# Patient Record
Sex: Male | Born: 1965 | Race: White | Hispanic: No | Marital: Married | Smoking: Never smoker
Health system: Southern US, Community
[De-identification: ages and names within clinical notes are randomized; demographics above are authoritative.]

## PROBLEM LIST (undated history)

## (undated) DIAGNOSIS — K5090 Crohn's disease, unspecified, without complications: Secondary | ICD-10-CM

## (undated) DIAGNOSIS — G4730 Sleep apnea, unspecified: Secondary | ICD-10-CM

## (undated) HISTORY — PX: ABDOMINAL SURGERY: SHX537

---

## 2017-04-06 ENCOUNTER — Inpatient Hospital Stay (HOSPITAL_COMMUNITY)
Admission: EM | Admit: 2017-04-06 | Discharge: 2017-04-07 | DRG: 101 | Disposition: A | Discharge status: Home / Self Care | Payer: Non-veteran care | Attending: Internal Medicine | Admitting: Internal Medicine

## 2017-04-06 ENCOUNTER — Emergency Department (HOSPITAL_COMMUNITY): Payer: Non-veteran care

## 2017-04-06 ENCOUNTER — Encounter (HOSPITAL_COMMUNITY): Payer: Self-pay | Admitting: *Deleted

## 2017-04-06 DIAGNOSIS — D72829 Elevated white blood cell count, unspecified: Secondary | ICD-10-CM | POA: Diagnosis present

## 2017-04-06 DIAGNOSIS — E7220 Disorder of urea cycle metabolism, unspecified: Secondary | ICD-10-CM | POA: Diagnosis present

## 2017-04-06 DIAGNOSIS — G4733 Obstructive sleep apnea (adult) (pediatric): Secondary | ICD-10-CM | POA: Diagnosis present

## 2017-04-06 DIAGNOSIS — F329 Major depressive disorder, single episode, unspecified: Secondary | ICD-10-CM | POA: Diagnosis present

## 2017-04-06 DIAGNOSIS — Z888 Allergy status to other drugs, medicaments and biological substances status: Secondary | ICD-10-CM

## 2017-04-06 DIAGNOSIS — N179 Acute kidney failure, unspecified: Secondary | ICD-10-CM | POA: Diagnosis present

## 2017-04-06 DIAGNOSIS — E871 Hypo-osmolality and hyponatremia: Secondary | ICD-10-CM | POA: Diagnosis present

## 2017-04-06 DIAGNOSIS — M6282 Rhabdomyolysis: Secondary | ICD-10-CM | POA: Diagnosis present

## 2017-04-06 DIAGNOSIS — F32A Depression, unspecified: Secondary | ICD-10-CM | POA: Diagnosis present

## 2017-04-06 DIAGNOSIS — G40409 Other generalized epilepsy and epileptic syndromes, not intractable, without status epilepticus: Secondary | ICD-10-CM | POA: Diagnosis not present

## 2017-04-06 DIAGNOSIS — E872 Acidosis, unspecified: Secondary | ICD-10-CM

## 2017-04-06 DIAGNOSIS — N289 Disorder of kidney and ureter, unspecified: Secondary | ICD-10-CM

## 2017-04-06 DIAGNOSIS — Z885 Allergy status to narcotic agent status: Secondary | ICD-10-CM

## 2017-04-06 DIAGNOSIS — R569 Unspecified convulsions: Secondary | ICD-10-CM

## 2017-04-06 DIAGNOSIS — K5090 Crohn's disease, unspecified, without complications: Secondary | ICD-10-CM | POA: Diagnosis present

## 2017-04-06 DIAGNOSIS — G4730 Sleep apnea, unspecified: Secondary | ICD-10-CM | POA: Diagnosis present

## 2017-04-06 HISTORY — DX: Sleep apnea, unspecified: G47.30

## 2017-04-06 HISTORY — DX: Crohn's disease, unspecified, without complications: K50.90

## 2017-04-06 LAB — URINALYSIS, COMPLETE (UACMP) WITH MICROSCOPIC
BACTERIA UA: NONE SEEN
BILIRUBIN URINE: NEGATIVE
Glucose, UA: NEGATIVE mg/dL
HGB URINE DIPSTICK: NEGATIVE
Ketones, ur: NEGATIVE mg/dL
LEUKOCYTES UA: NEGATIVE
NITRITE: NEGATIVE
PROTEIN: NEGATIVE mg/dL
SPECIFIC GRAVITY, URINE: 1.013 (ref 1.005–1.030)
SQUAMOUS EPITHELIAL / LPF: NONE SEEN
pH: 5 (ref 5.0–8.0)

## 2017-04-06 LAB — ETHANOL

## 2017-04-06 LAB — BASIC METABOLIC PANEL
Anion gap: 17 — ABNORMAL HIGH (ref 5–15)
BUN: 15 mg/dL (ref 6–20)
CHLORIDE: 98 mmol/L — AB (ref 101–111)
CO2: 14 mmol/L — ABNORMAL LOW (ref 22–32)
CREATININE: 1.74 mg/dL — AB (ref 0.61–1.24)
Calcium: 8.8 mg/dL — ABNORMAL LOW (ref 8.9–10.3)
GFR calc non Af Amer: 44 mL/min — ABNORMAL LOW (ref >60)
GFR, EST AFRICAN AMERICAN: 51 mL/min — AB (ref >60)
GLUCOSE: 165 mg/dL — AB (ref 65–99)
Potassium: 4.8 mmol/L (ref 3.5–5.1)
SODIUM: 129 mmol/L — AB (ref 135–145)

## 2017-04-06 LAB — HEPATIC FUNCTION PANEL
ALT: 45 U/L (ref 17–63)
AST: 42 U/L — ABNORMAL HIGH (ref 15–41)
Albumin: 4.3 g/dL (ref 3.5–5.0)
Alkaline Phosphatase: 91 U/L (ref 38–126)
Bilirubin, Direct: <0.1 mg/dL — ABNORMAL LOW (ref 0.1–0.5)
TOTAL PROTEIN: 7.6 g/dL (ref 6.5–8.1)
Total Bilirubin: 0.6 mg/dL (ref 0.3–1.2)

## 2017-04-06 LAB — MAGNESIUM: Magnesium: 2.7 mg/dL — ABNORMAL HIGH (ref 1.7–2.4)

## 2017-04-06 LAB — AMMONIA
AMMONIA: 48 umol/L — AB (ref 9–35)
Ammonia: 156 umol/L — ABNORMAL HIGH (ref 9–35)

## 2017-04-06 LAB — RAPID URINE DRUG SCREEN, HOSP PERFORMED
AMPHETAMINES: NOT DETECTED
Barbiturates: NOT DETECTED
Benzodiazepines: POSITIVE — AB
Cocaine: NOT DETECTED
OPIATES: NOT DETECTED
TETRAHYDROCANNABINOL: NOT DETECTED

## 2017-04-06 LAB — PHOSPHORUS: PHOSPHORUS: 3 mg/dL (ref 2.5–4.6)

## 2017-04-06 LAB — I-STAT ARTERIAL BLOOD GAS, ED
ACID-BASE DEFICIT: 7 mmol/L — AB (ref 0.0–2.0)
Bicarbonate: 20.3 mmol/L (ref 20.0–28.0)
O2 SAT: 100 %
PH ART: 7.257 — AB (ref 7.350–7.450)
TCO2: 22 mmol/L (ref 22–32)
pCO2 arterial: 45.3 mmHg (ref 32.0–48.0)
pO2, Arterial: 501 mmHg — ABNORMAL HIGH (ref 83.0–108.0)

## 2017-04-06 LAB — CBG MONITORING, ED: GLUCOSE-CAPILLARY: 164 mg/dL — AB (ref 65–99)

## 2017-04-06 LAB — CBC
HCT: 43.2 % (ref 39.0–52.0)
Hemoglobin: 14.6 g/dL (ref 13.0–17.0)
MCH: 30.9 pg (ref 26.0–34.0)
MCHC: 33.8 g/dL (ref 30.0–36.0)
MCV: 91.5 fL (ref 78.0–100.0)
PLATELETS: 248 10*3/uL (ref 150–400)
RBC: 4.72 MIL/uL (ref 4.22–5.81)
RDW: 12.9 % (ref 11.5–15.5)
WBC: 14.7 10*3/uL — ABNORMAL HIGH (ref 4.0–10.5)

## 2017-04-06 LAB — SALICYLATE LEVEL: Salicylate Lvl: <7 mg/dL (ref 2.8–30.0)

## 2017-04-06 LAB — I-STAT TROPONIN, ED: TROPONIN I, POC: 0.02 ng/mL (ref 0.00–0.08)

## 2017-04-06 LAB — I-STAT CG4 LACTIC ACID, ED: LACTIC ACID, VENOUS: 9.13 mmol/L — AB (ref 0.5–1.9)

## 2017-04-06 LAB — ACETAMINOPHEN LEVEL: Acetaminophen (Tylenol), Serum: <10 ug/mL — ABNORMAL LOW (ref 10–30)

## 2017-04-06 MED ORDER — SODIUM CHLORIDE 0.9 % IV BOLUS (SEPSIS)
1000.0000 mL | Freq: Once | INTRAVENOUS | Status: AC
  Administered 2017-04-06: 1000 mL via INTRAVENOUS

## 2017-04-06 MED ORDER — SODIUM CHLORIDE 0.9 % IV SOLN
1500.0000 mg | Freq: Once | INTRAVENOUS | Status: AC
  Administered 2017-04-06: 1500 mg via INTRAVENOUS
  Filled 2017-04-06: qty 15

## 2017-04-06 MED ORDER — ONDANSETRON HCL 4 MG/2ML IJ SOLN
4.0000 mg | Freq: Once | INTRAMUSCULAR | Status: AC
Start: 2017-04-06 — End: 2017-04-06
  Administered 2017-04-06: 4 mg via INTRAVENOUS

## 2017-04-06 MED ORDER — ONDANSETRON HCL 4 MG/2ML IJ SOLN
INTRAMUSCULAR | Status: AC
  Filled 2017-04-06: qty 2

## 2017-04-06 MED ORDER — FENTANYL CITRATE (PF) 100 MCG/2ML IJ SOLN
50.0000 ug | Freq: Once | INTRAMUSCULAR | Status: AC
  Administered 2017-04-06: 50 ug via INTRAVENOUS

## 2017-04-06 MED ORDER — OXYCODONE-ACETAMINOPHEN 5-325 MG PO TABS
1.0000 | ORAL_TABLET | Freq: Once | ORAL | Status: AC
  Administered 2017-04-07: 1 via ORAL
  Filled 2017-04-06: qty 1

## 2017-04-06 MED ORDER — FENTANYL CITRATE (PF) 100 MCG/2ML IJ SOLN
INTRAMUSCULAR | Status: AC
  Filled 2017-04-06: qty 2

## 2017-04-06 NOTE — ED Notes (Signed)
Patient transported to CT 
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
## 2017-04-06 NOTE — ED Notes (Signed)
Neurologist at bedside. 

## 2017-04-06 NOTE — ED Notes (Signed)
C-collar and nasal trumpet removed per Dr.Harris

## 2017-04-07 ENCOUNTER — Encounter (HOSPITAL_COMMUNITY): Payer: Self-pay | Admitting: Family Medicine

## 2017-04-07 DIAGNOSIS — E872 Acidosis, unspecified: Secondary | ICD-10-CM | POA: Diagnosis present

## 2017-04-07 DIAGNOSIS — G4730 Sleep apnea, unspecified: Secondary | ICD-10-CM | POA: Diagnosis present

## 2017-04-07 DIAGNOSIS — K50919 Crohn's disease, unspecified, with unspecified complications: Secondary | ICD-10-CM

## 2017-04-07 DIAGNOSIS — G4733 Obstructive sleep apnea (adult) (pediatric): Secondary | ICD-10-CM | POA: Diagnosis present

## 2017-04-07 DIAGNOSIS — K5090 Crohn's disease, unspecified, without complications: Secondary | ICD-10-CM | POA: Diagnosis present

## 2017-04-07 DIAGNOSIS — R569 Unspecified convulsions: Secondary | ICD-10-CM | POA: Diagnosis not present

## 2017-04-07 DIAGNOSIS — F32A Depression, unspecified: Secondary | ICD-10-CM | POA: Diagnosis present

## 2017-04-07 DIAGNOSIS — E7220 Disorder of urea cycle metabolism, unspecified: Secondary | ICD-10-CM

## 2017-04-07 DIAGNOSIS — N179 Acute kidney failure, unspecified: Secondary | ICD-10-CM | POA: Diagnosis present

## 2017-04-07 DIAGNOSIS — F329 Major depressive disorder, single episode, unspecified: Secondary | ICD-10-CM | POA: Diagnosis present

## 2017-04-07 DIAGNOSIS — N289 Disorder of kidney and ureter, unspecified: Secondary | ICD-10-CM

## 2017-04-07 DIAGNOSIS — Z888 Allergy status to other drugs, medicaments and biological substances status: Secondary | ICD-10-CM | POA: Diagnosis not present

## 2017-04-07 DIAGNOSIS — Z885 Allergy status to narcotic agent status: Secondary | ICD-10-CM | POA: Diagnosis not present

## 2017-04-07 DIAGNOSIS — E871 Hypo-osmolality and hyponatremia: Secondary | ICD-10-CM | POA: Diagnosis present

## 2017-04-07 DIAGNOSIS — M6282 Rhabdomyolysis: Secondary | ICD-10-CM | POA: Diagnosis present

## 2017-04-07 DIAGNOSIS — D72829 Elevated white blood cell count, unspecified: Secondary | ICD-10-CM | POA: Diagnosis present

## 2017-04-07 DIAGNOSIS — G40409 Other generalized epilepsy and epileptic syndromes, not intractable, without status epilepticus: Secondary | ICD-10-CM | POA: Diagnosis present

## 2017-04-07 LAB — CBC WITH DIFFERENTIAL/PLATELET
BASOS ABS: 0 10*3/uL (ref 0.0–0.1)
Basophils Relative: 0 %
EOS PCT: 1 %
Eosinophils Absolute: 0.1 10*3/uL (ref 0.0–0.7)
HCT: 34.8 % — ABNORMAL LOW (ref 39.0–52.0)
Hemoglobin: 11.8 g/dL — ABNORMAL LOW (ref 13.0–17.0)
LYMPHS ABS: 1.5 10*3/uL (ref 0.7–4.0)
LYMPHS PCT: 12 %
MCH: 31 pg (ref 26.0–34.0)
MCHC: 33.9 g/dL (ref 30.0–36.0)
MCV: 91.3 fL (ref 78.0–100.0)
MONO ABS: 1.1 10*3/uL — AB (ref 0.1–1.0)
Monocytes Relative: 9 %
Neutro Abs: 9.3 10*3/uL — ABNORMAL HIGH (ref 1.7–7.7)
Neutrophils Relative %: 78 %
PLATELETS: 176 10*3/uL (ref 150–400)
RBC: 3.81 MIL/uL — ABNORMAL LOW (ref 4.22–5.81)
RDW: 13.4 % (ref 11.5–15.5)
WBC: 12 10*3/uL — ABNORMAL HIGH (ref 4.0–10.5)

## 2017-04-07 LAB — LACTIC ACID, PLASMA
Lactic Acid, Venous: 1.2 mmol/L (ref 0.5–1.9)
Lactic Acid, Venous: 1.2 mmol/L (ref 0.5–1.9)
Lactic Acid, Venous: 1.4 mmol/L (ref 0.5–1.9)
Lactic Acid, Venous: 1.3 mmol/L (ref 0.5–1.9)

## 2017-04-07 LAB — COMPREHENSIVE METABOLIC PANEL
ALBUMIN: 3.4 g/dL — AB (ref 3.5–5.0)
ALK PHOS: 71 U/L (ref 38–126)
ALT: 39 U/L (ref 17–63)
AST: 42 U/L — AB (ref 15–41)
Anion gap: 5 (ref 5–15)
BILIRUBIN TOTAL: 0.8 mg/dL (ref 0.3–1.2)
BUN: 12 mg/dL (ref 6–20)
CO2: 27 mmol/L (ref 22–32)
CREATININE: 1.64 mg/dL — AB (ref 0.61–1.24)
Calcium: 8 mg/dL — ABNORMAL LOW (ref 8.9–10.3)
Chloride: 107 mmol/L (ref 101–111)
GFR calc Af Amer: 55 mL/min — ABNORMAL LOW (ref >60)
GFR calc non Af Amer: 47 mL/min — ABNORMAL LOW (ref >60)
GLUCOSE: 113 mg/dL — AB (ref 65–99)
POTASSIUM: 4.2 mmol/L (ref 3.5–5.1)
Sodium: 139 mmol/L (ref 135–145)
TOTAL PROTEIN: 5.8 g/dL — AB (ref 6.5–8.1)

## 2017-04-07 LAB — BASIC METABOLIC PANEL
Anion gap: 6 (ref 5–15)
BUN: 11 mg/dL (ref 6–20)
CALCIUM: 8 mg/dL — AB (ref 8.9–10.3)
CO2: 23 mmol/L (ref 22–32)
Chloride: 109 mmol/L (ref 101–111)
Creatinine, Ser: 1.48 mg/dL — ABNORMAL HIGH (ref 0.61–1.24)
GFR calc Af Amer: >60 mL/min (ref >60)
GFR, EST NON AFRICAN AMERICAN: 53 mL/min — AB (ref >60)
GLUCOSE: 116 mg/dL — AB (ref 65–99)
Potassium: 4 mmol/L (ref 3.5–5.1)
Sodium: 138 mmol/L (ref 135–145)

## 2017-04-07 LAB — SODIUM, URINE, RANDOM: Sodium, Ur: 118 mmol/L

## 2017-04-07 LAB — AMMONIA: Ammonia: 26 umol/L (ref 9–35)

## 2017-04-07 LAB — CK: Total CK: 542 U/L — ABNORMAL HIGH (ref 49–397)

## 2017-04-07 LAB — HIV ANTIBODY (ROUTINE TESTING W REFLEX): HIV SCREEN 4TH GENERATION: NONREACTIVE

## 2017-04-07 LAB — OSMOLALITY, URINE: OSMOLALITY UR: 477 mosm/kg (ref 300–900)

## 2017-04-07 LAB — CREATININE, URINE, RANDOM: CREATININE, URINE: 94.73 mg/dL

## 2017-04-07 MED ORDER — LACTULOSE 10 GM/15ML PO SOLN
20.0000 g | Freq: Two times a day (BID) | ORAL | Status: DC
  Filled 2017-04-07 (×3): qty 30

## 2017-04-07 MED ORDER — ACETAMINOPHEN 325 MG PO TABS: 650.0000 mg | ORAL_TABLET | Freq: Four times a day (QID) | ORAL | Status: DC | PRN

## 2017-04-07 MED ORDER — ONDANSETRON HCL 4 MG/2ML IJ SOLN: 4.0000 mg | Freq: Four times a day (QID) | INTRAMUSCULAR | Status: DC | PRN

## 2017-04-07 MED ORDER — ONDANSETRON HCL 4 MG PO TABS: 4.0000 mg | ORAL_TABLET | Freq: Four times a day (QID) | ORAL | Status: DC | PRN

## 2017-04-07 MED ORDER — LORAZEPAM 2 MG/ML IJ SOLN
2.0000 mg | Freq: Once | INTRAMUSCULAR | Status: DC | PRN
Start: 2017-04-07 — End: 2017-04-07

## 2017-04-07 MED ORDER — SODIUM CHLORIDE 0.9 % IV SOLN
INTRAVENOUS | Status: DC
  Administered 2017-04-07: 11:00:00 via INTRAVENOUS

## 2017-04-07 MED ORDER — ACETAMINOPHEN 650 MG RE SUPP: 650.0000 mg | Freq: Four times a day (QID) | RECTAL | Status: DC | PRN

## 2017-04-07 MED ORDER — SENNOSIDES-DOCUSATE SODIUM 8.6-50 MG PO TABS: 1.0000 | ORAL_TABLET | Freq: Every evening | ORAL | Status: DC | PRN

## 2017-04-07 MED ORDER — HEPARIN SODIUM (PORCINE) 5000 UNIT/ML IJ SOLN
5000.0000 [IU] | Freq: Three times a day (TID) | INTRAMUSCULAR | Status: DC
  Administered 2017-04-07: 5000 [IU] via SUBCUTANEOUS
  Filled 2017-04-07: qty 1

## 2017-04-07 MED ORDER — BISACODYL 5 MG PO TBEC: 5.0000 mg | DELAYED_RELEASE_TABLET | Freq: Every day | ORAL | Status: DC | PRN

## 2017-04-07 MED ORDER — HYDROCODONE-ACETAMINOPHEN 5-325 MG PO TABS
1.0000 | ORAL_TABLET | ORAL | Status: DC | PRN
  Administered 2017-04-07: 1 via ORAL
  Administered 2017-04-07: 2 via ORAL
  Filled 2017-04-07: qty 1
  Filled 2017-04-07: qty 2

## 2017-04-07 MED ORDER — SODIUM CHLORIDE 0.9 % IV SOLN
INTRAVENOUS | Status: DC
  Administered 2017-04-07: 03:00:00 via INTRAVENOUS

## 2017-04-07 MED ORDER — BUPROPION HCL ER (SR) 100 MG PO TB12: 100.0000 mg | ORAL_TABLET | Freq: Two times a day (BID) | ORAL | Status: DC

## 2017-04-07 NOTE — ED Notes (Signed)
Ammonia level decreased to 26; Dr.Opyd aware; lactulose to be discontinued
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
## 2017-04-07 NOTE — ED Notes (Signed)
Pt ambulatory with steady gait to restroom; c/o back pain and stiffness. Pt moved to hospital bed while waiting admission
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
## 2017-04-07 NOTE — ED Notes (Signed)
Dr. Opyd at bedside  
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
## 2019-04-11 IMAGING — CT CT HEAD W/O CM
4 series · 17 of 47 positions shown, 19 images · non-contrast
Comparison: None

CLINICAL DATA: Seizure.

EXAM:
CT HEAD WITHOUT CONTRAST
TECHNIQUE: Contiguous axial images were obtained from the base of the skull
through the vertex without intravenous contrast.

[Series 3: head wo · axial · 0.41mm/px · z∈[+1550,+1670]mm · 7 of 32 slices shown, 9 images]
[im 4/32  brain]
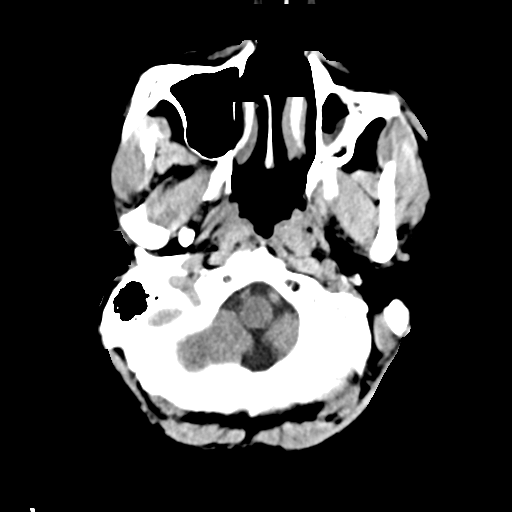
[im 4/32  bone]
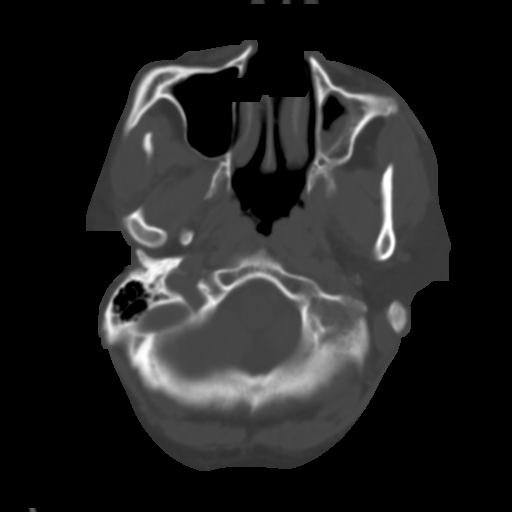
[im 8/32  brain]
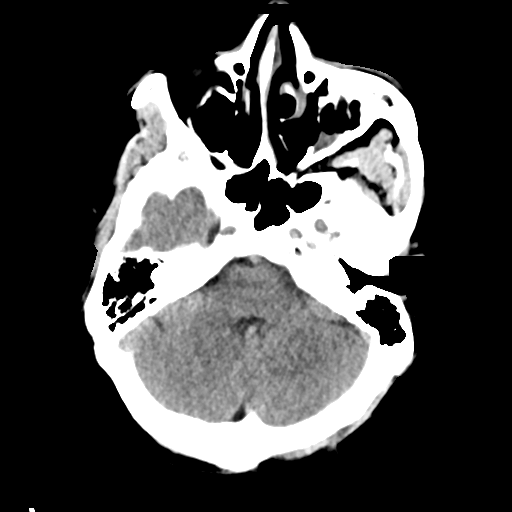
[im 12/32  brain]
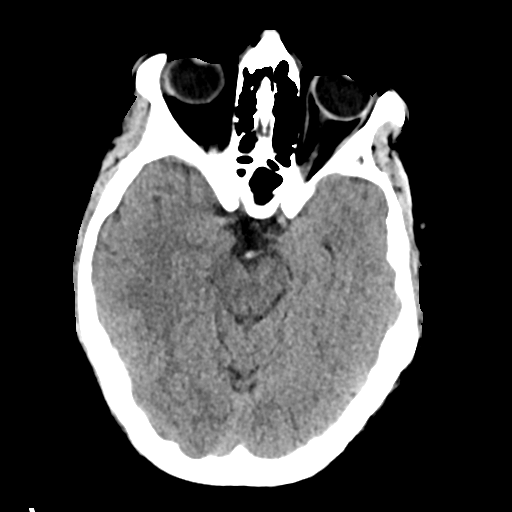
[im 16/32  brain]
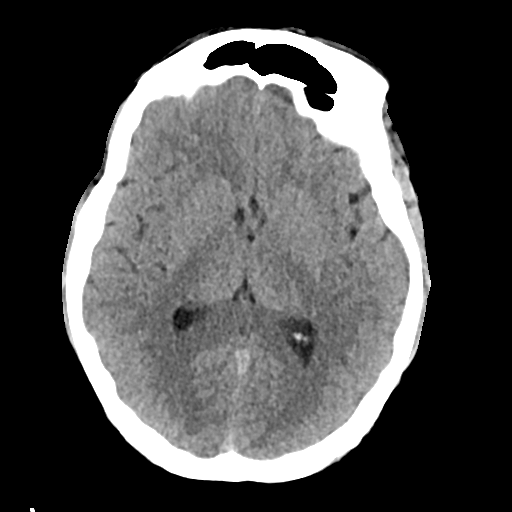
[im 20/32  brain]
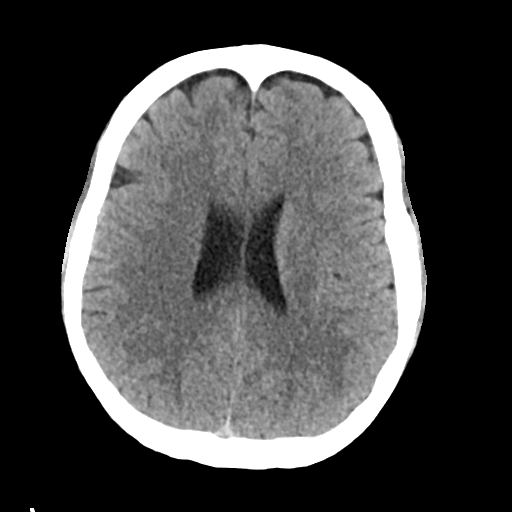
[im 20/32  bone]
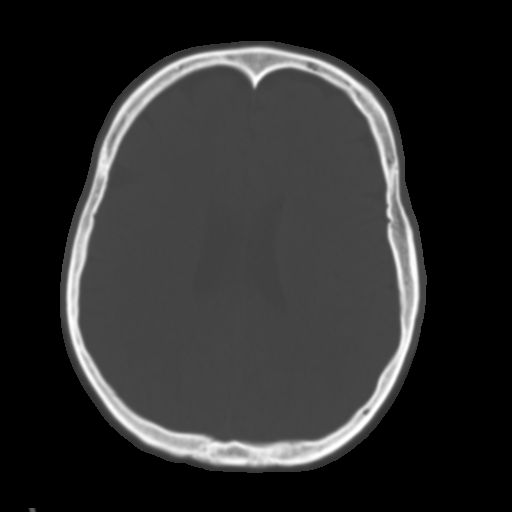
[im 24/32  brain]
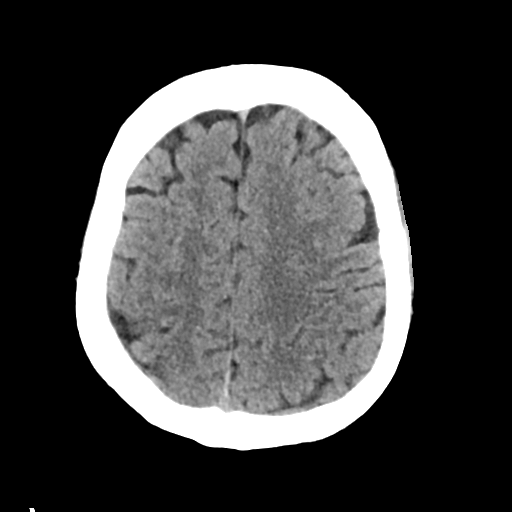
[im 28/32  brain]
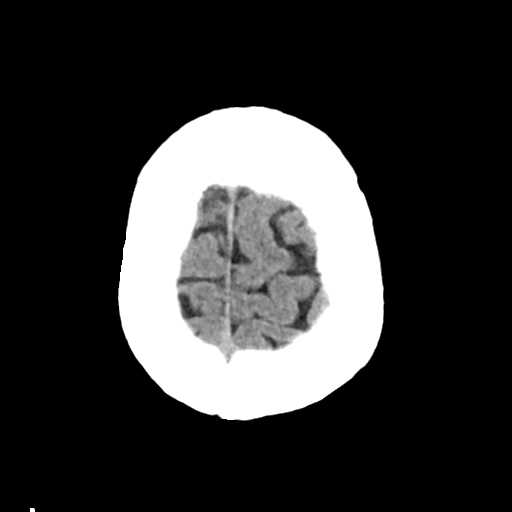

[Series 4: head bone · axial · 0.41mm/px · z∈[+1550,+1604]mm · 4 of 78 slices shown]
[im 8/78  bone]
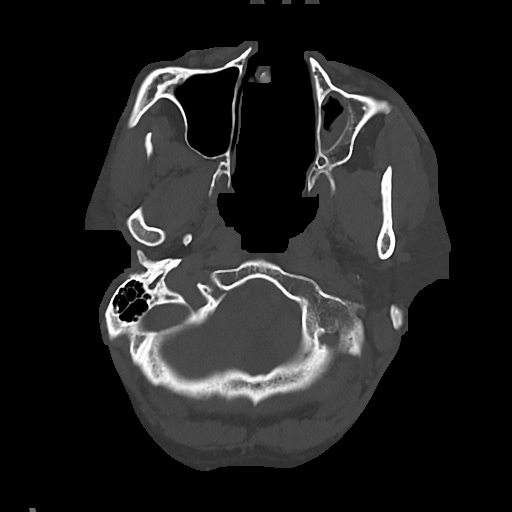
[im 16/78  bone]
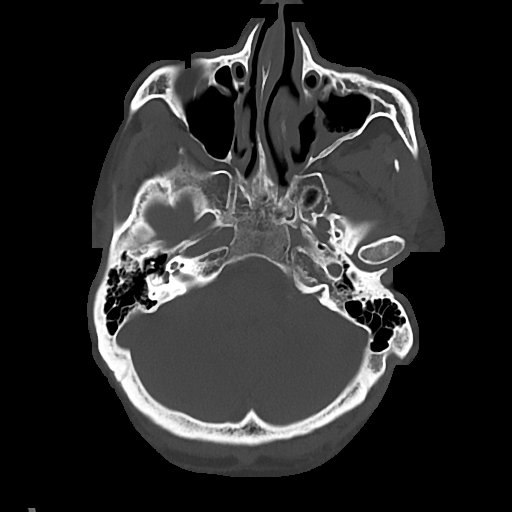
[im 24/78  bone]
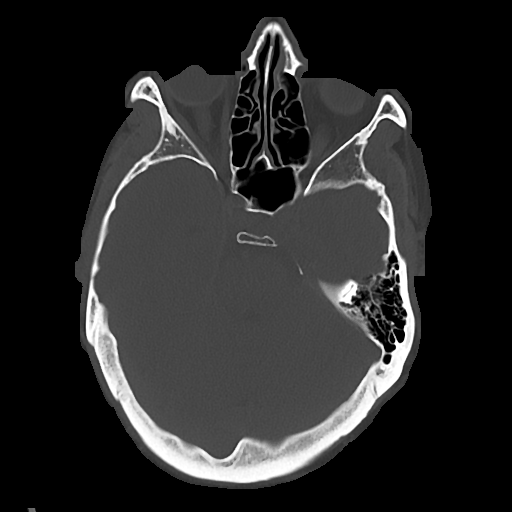
[im 35/78  bone]
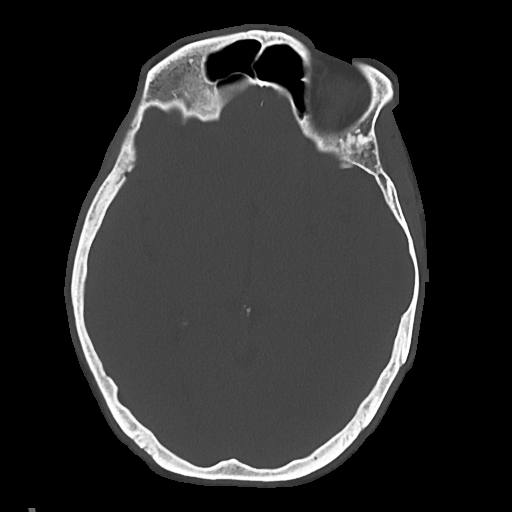

[Series 5: cor soft · coronal · 0.31mm/px · 3 of 68 slices shown]
[im 23/68  brain]
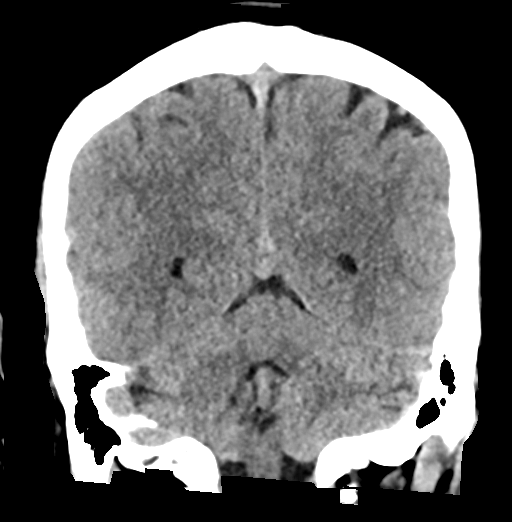
[im 30/68  brain]
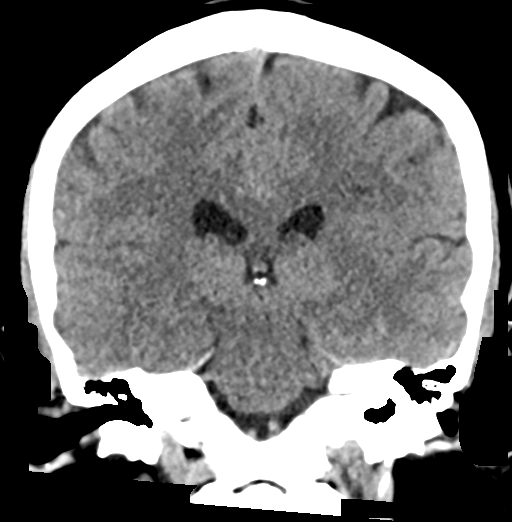
[im 38/68  brain]
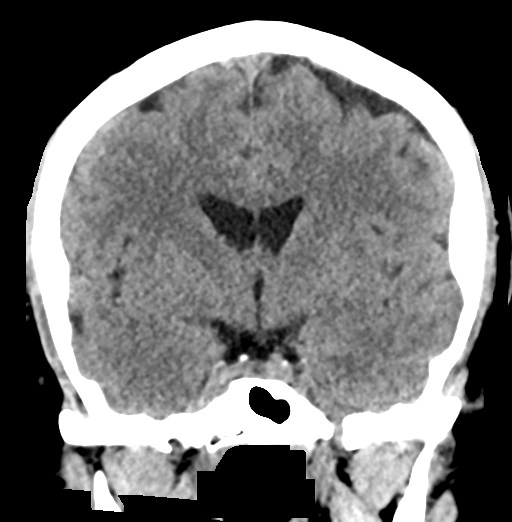

[Series 6: sag soft · sagittal · 0.32mm/px · 3 of 58 slices shown]
[im 20/58  brain]
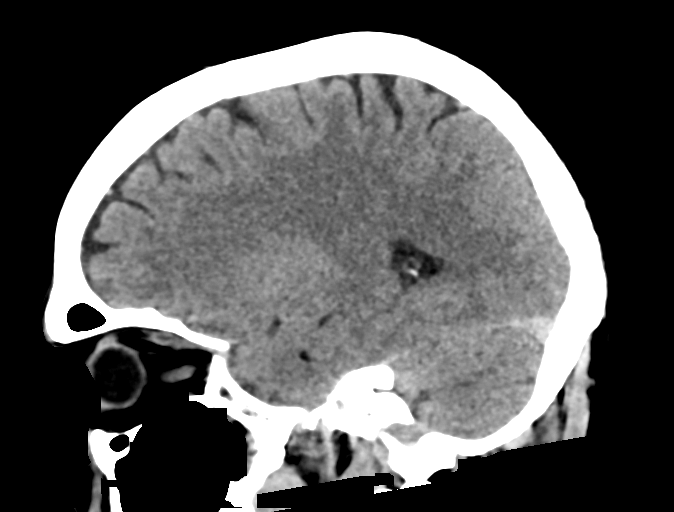
[im 29/58  brain]
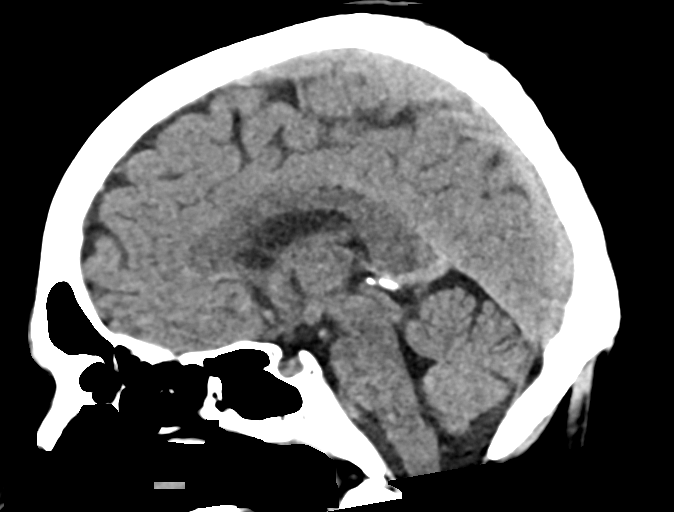
[im 39/58  brain]
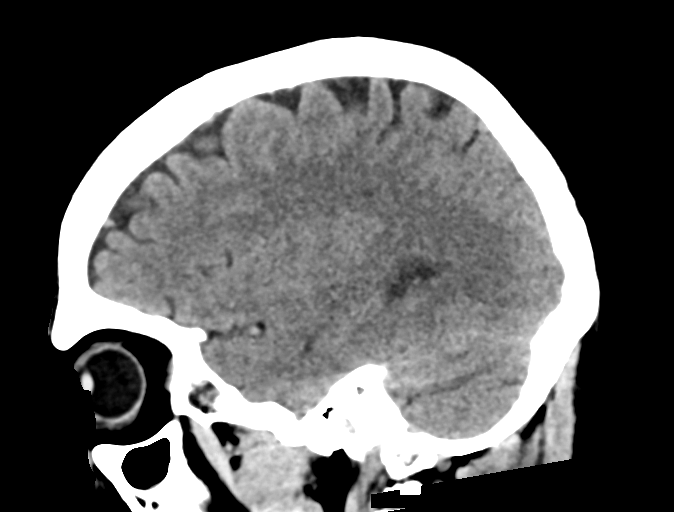

[17 of 47 positions shown; findings below may reference images not displayed]

FINDINGS: Brain: No evidence of acute infarction, hemorrhage, hydrocephalus,
extra-axial collection or mass lesion/mass effect.

Vascular: No hyperdense vessel or unexpected calcification.

Skull: The mastoid air cells are clear. There is mucosal thickening
involving the left maxillary sinus. The remaining paranasal sinuses
are clear.

Sinuses/Orbits: No acute finding.

Other: None.
IMPRESSION: 1. Normal brain.
2. Left maxillary sinus mucosal thickening.

## 2019-04-11 IMAGING — CR DG CHEST 1V PORT
1 series · 1 of 1 positions shown · non-contrast
Comparison: None.

CLINICAL DATA: Seizure.  Vomiting, concern for aspiration.

EXAM:
PORTABLE CHEST 1 VIEW

[AP]
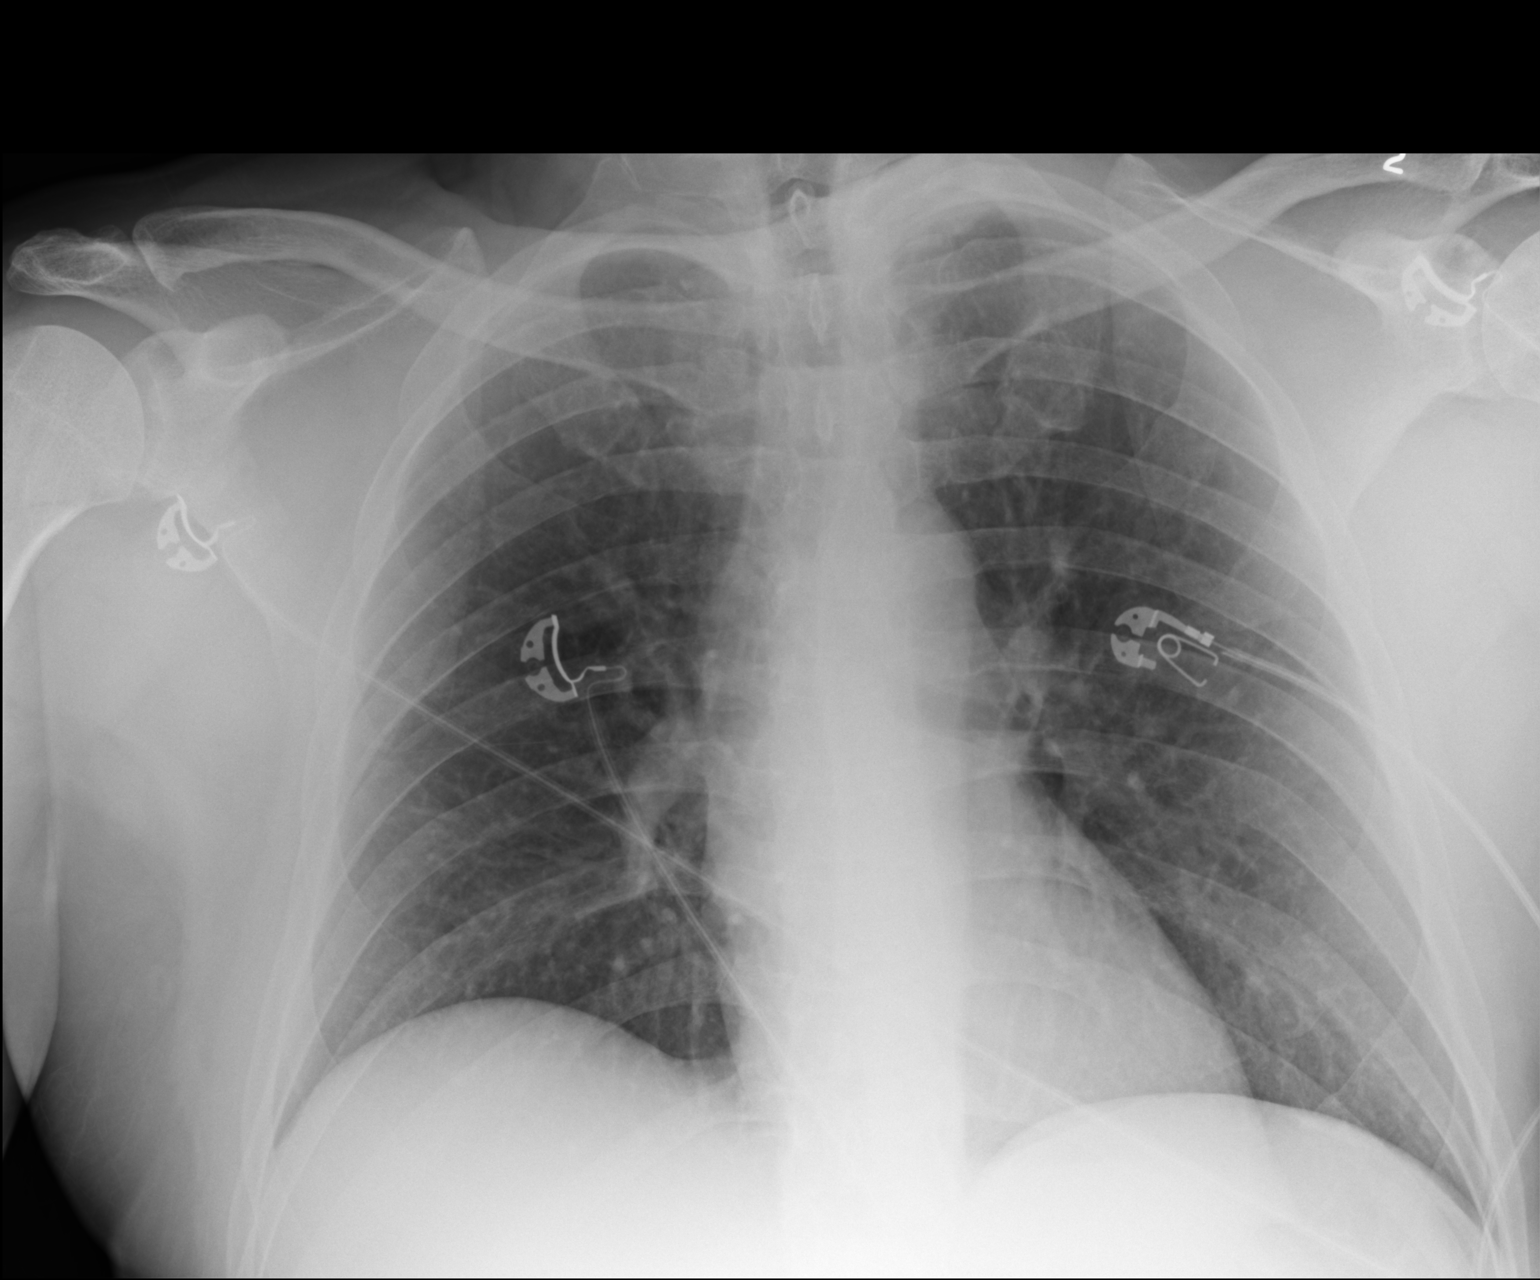

[1 of 1 positions shown; findings below may reference images not displayed]

FINDINGS: The cardiomediastinal contours are normal. The lungs are clear.
Pulmonary vasculature is normal. No consolidation, pleural effusion,
or pneumothorax. No acute osseous abnormalities are seen.
IMPRESSION: No active disease.
